# Patient Record
Sex: Male | Born: 1960 | Race: White | Hispanic: No | Smoking: Never smoker
Health system: Southern US, Community
[De-identification: ages and names within clinical notes are randomized; demographics above are authoritative.]

## PROBLEM LIST (undated history)

## (undated) DIAGNOSIS — Z87820 Personal history of traumatic brain injury: Secondary | ICD-10-CM

## (undated) DIAGNOSIS — I669 Occlusion and stenosis of unspecified cerebral artery: Secondary | ICD-10-CM

## (undated) DIAGNOSIS — B958 Unspecified staphylococcus as the cause of diseases classified elsewhere: Secondary | ICD-10-CM

## (undated) HISTORY — PX: BRAIN SURGERY: SHX531

---

## 2006-08-26 ENCOUNTER — Encounter: Payer: Self-pay | Admitting: Internal Medicine

## 2007-01-26 ENCOUNTER — Ambulatory Visit: Payer: Self-pay | Admitting: Internal Medicine

## 2007-01-26 DIAGNOSIS — R945 Abnormal results of liver function studies: Secondary | ICD-10-CM

## 2007-01-26 DIAGNOSIS — F988 Other specified behavioral and emotional disorders with onset usually occurring in childhood and adolescence: Secondary | ICD-10-CM | POA: Insufficient documentation

## 2007-01-26 DIAGNOSIS — I6200 Nontraumatic subdural hemorrhage, unspecified: Secondary | ICD-10-CM

## 2007-01-26 DIAGNOSIS — E785 Hyperlipidemia, unspecified: Secondary | ICD-10-CM | POA: Insufficient documentation

## 2007-01-26 DIAGNOSIS — R7989 Other specified abnormal findings of blood chemistry: Secondary | ICD-10-CM | POA: Insufficient documentation

## 2007-02-11 ENCOUNTER — Encounter (INDEPENDENT_AMBULATORY_CARE_PROVIDER_SITE_OTHER): Payer: Self-pay | Admitting: *Deleted

## 2007-02-17 ENCOUNTER — Ambulatory Visit: Payer: Self-pay | Admitting: Internal Medicine

## 2007-02-22 ENCOUNTER — Encounter (INDEPENDENT_AMBULATORY_CARE_PROVIDER_SITE_OTHER): Payer: Self-pay | Admitting: *Deleted

## 2007-02-25 ENCOUNTER — Encounter: Payer: Self-pay | Admitting: Internal Medicine

## 2007-09-07 ENCOUNTER — Encounter: Payer: Self-pay | Admitting: Internal Medicine

## 2007-11-17 IMAGING — US US ABDOMEN COMPLETE
1 series · 13 of 25 positions shown · non-contrast
Comparison: none

ACCESSION #:  [id number]

 READING PHYSICIAN:  Becka, Arbiona.
 INDICATIONS FOR PROCEDURE:  Abnormal liver function test.
 PROCEDURE:  Multiplanar abdominal ultrasound imaging was performed in the upright, supine, right and left lateral decubitus positions.
 RESULTS:  Abdominal aorta 33 mm with focal dilatation at the proximal portion.  No aneurysm.  The IVC is patent. 
 The pancreas appears normal throughout the head, body and tail without evidence of ductal dilatation, pancreatic masses, or peripancreatic inflammation.  
 Gallbladder is well distended, normal wall thickness of 2.7 mm, with no pericholecystic fluid or intraluminal echogenic foci to suggest gallstone disease.  
 The common bile duct measures 3.8 mm in maximal diameter without evidence of intraluminal foci. 
 The liver with mild increase in echo density, homogenous pattern, no dilated ducts or focal lesions. 
 Kidneys are normal in appearance.  The right kidney 116 mm, left kidney 136 mm. 
 Spleen is normal in size, measuring 110 mm without parenchymal lesion.

[Series 1: us abdomen complete · 0.40mm/px · 13 of 60 slices shown]
[im 1/60]
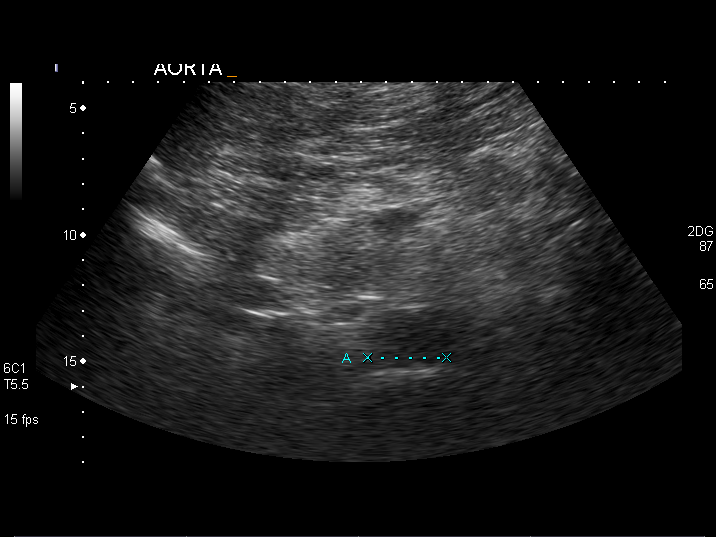
[im 5/60]
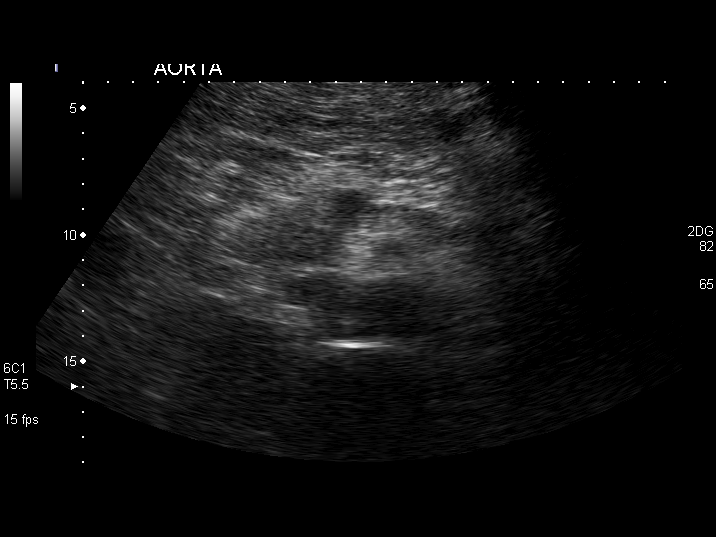
[im 10/60]
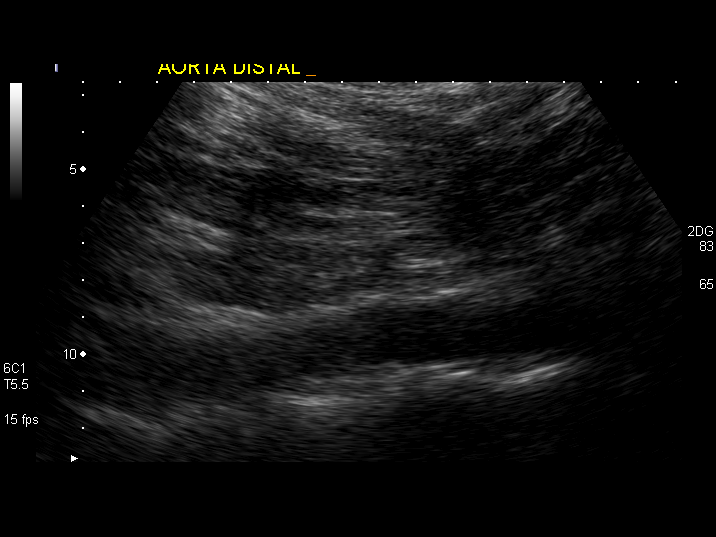
[im 15/60]
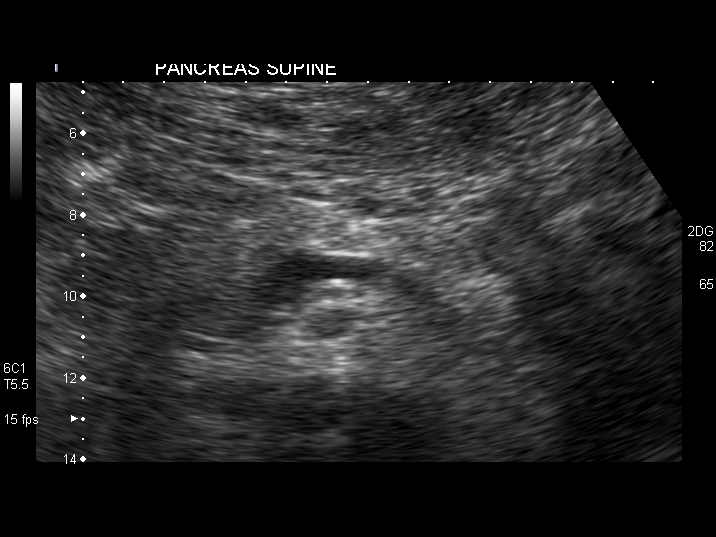
[im 20/60]
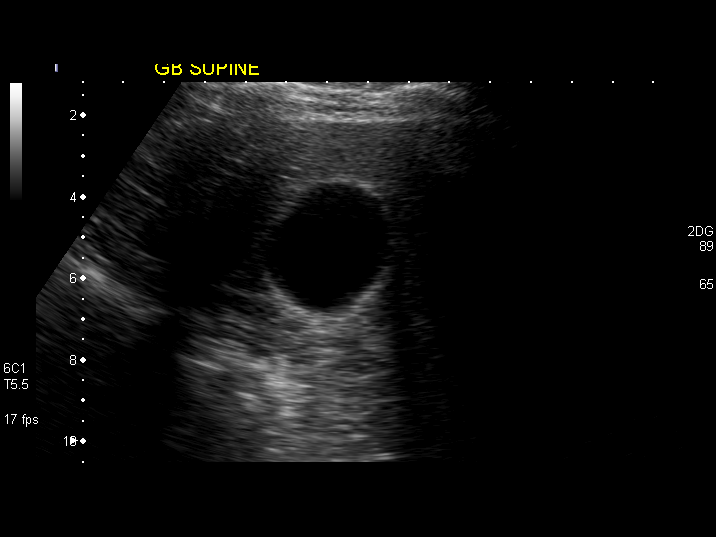
[im 25/60]
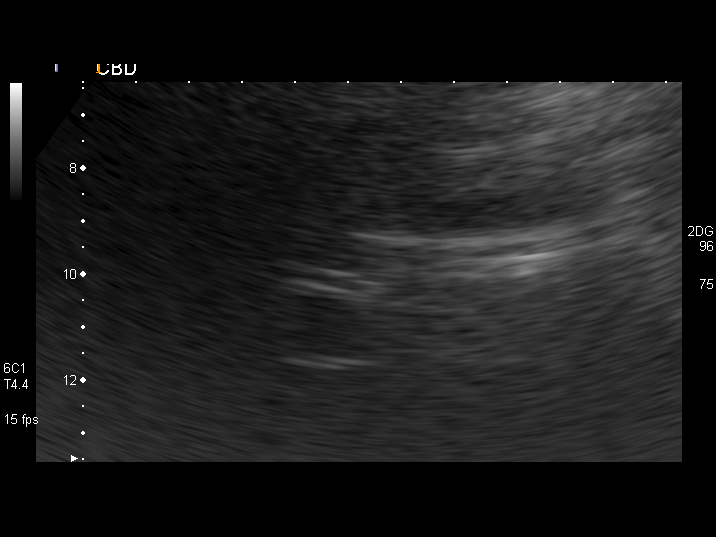
[im 30/60]
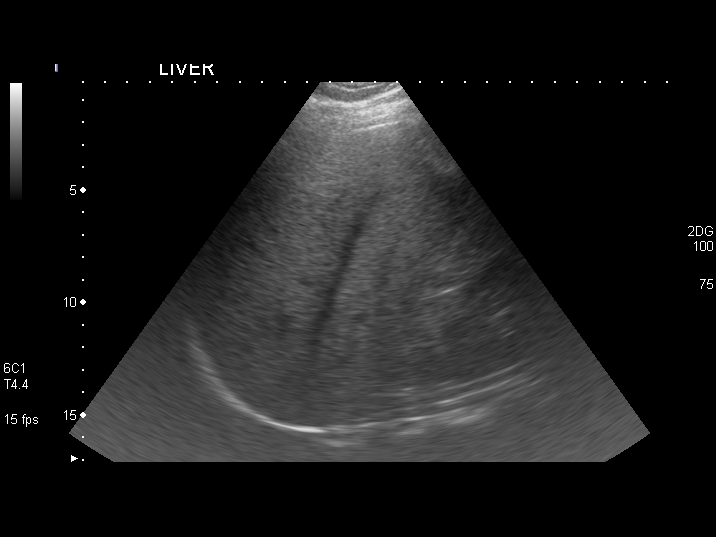
[im 35/60]
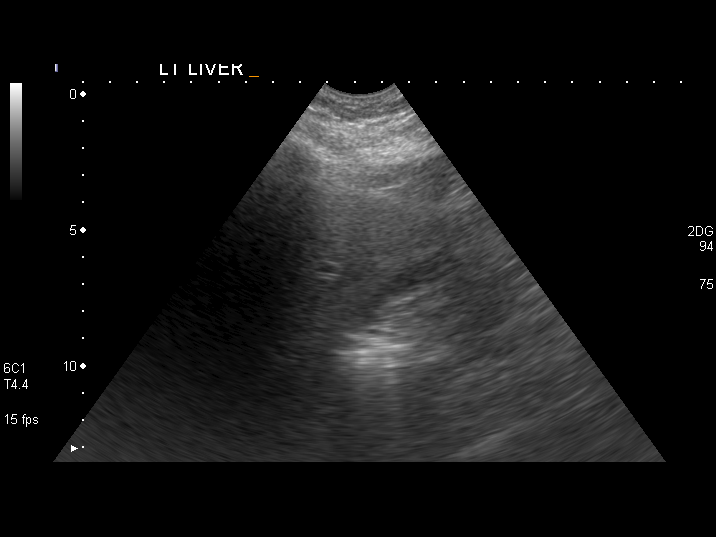
[im 40/60]
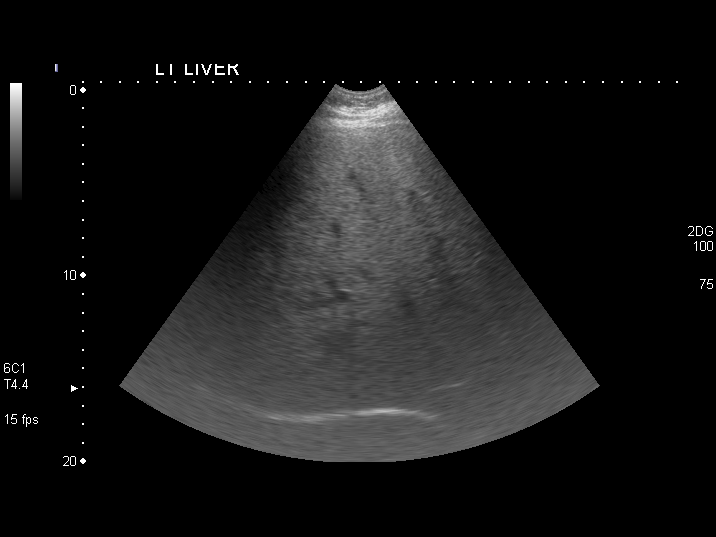
[im 45/60]
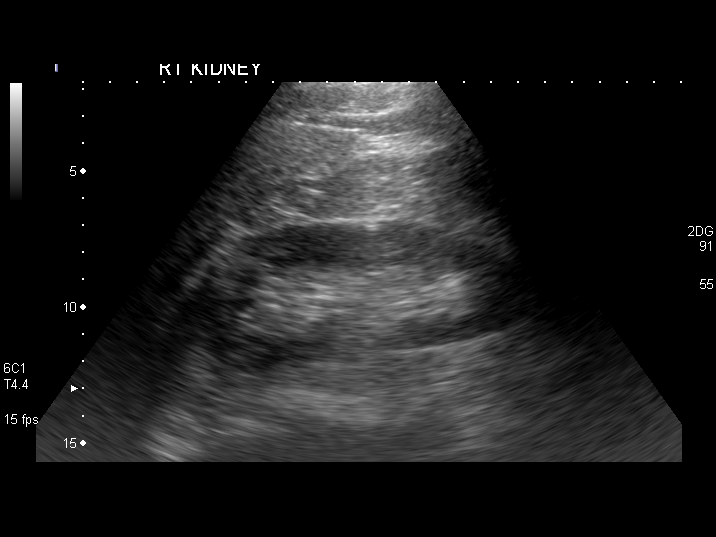
[im 50/60]
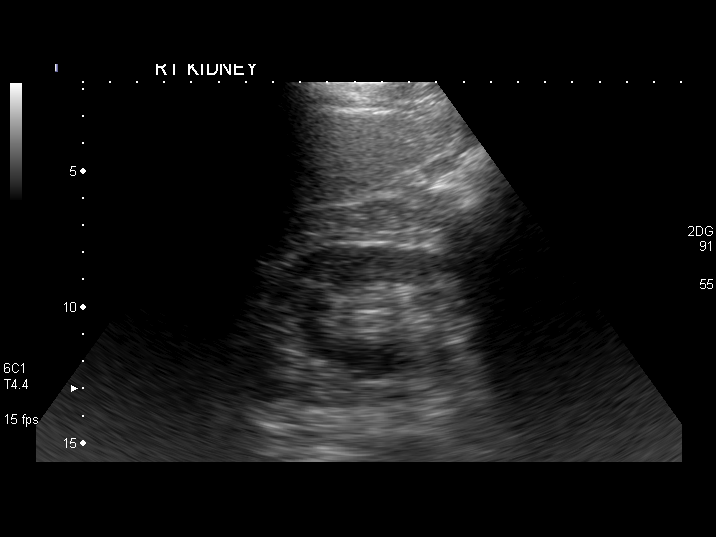
[im 55/60]
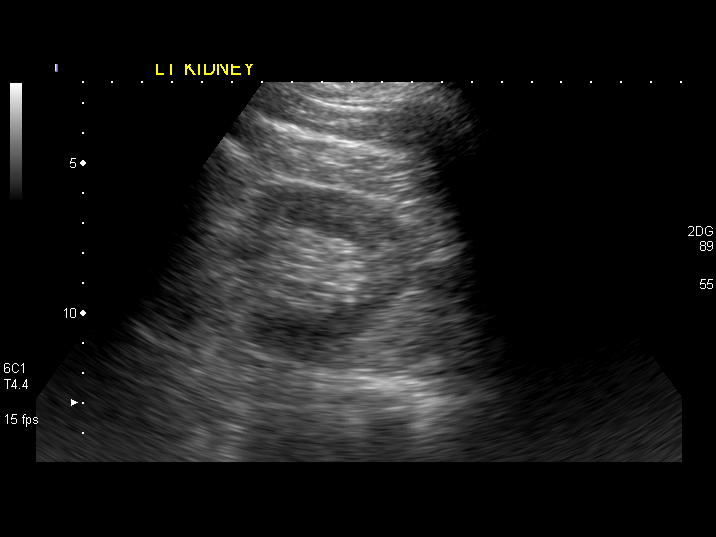
[im 60/60]
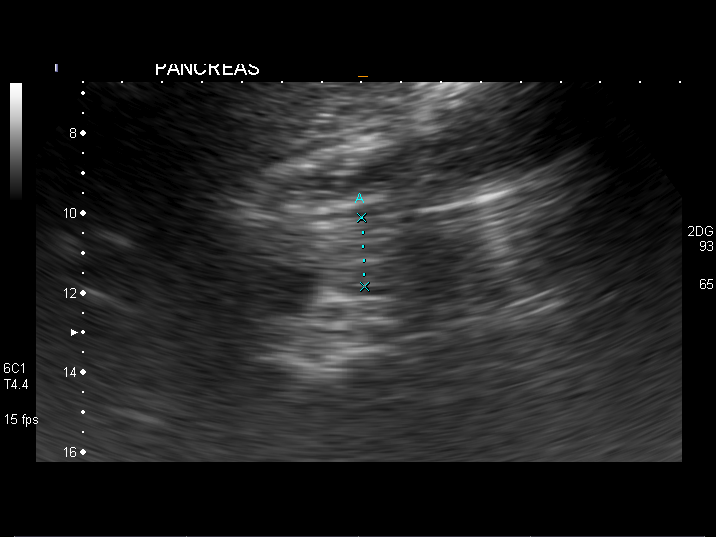

[13 of 25 positions shown; findings below may reference images not displayed]

IMPRESSION: 1.         Mild fatty infiltration of the liver, spleen upper limits of normal.
 2.         Normal gallbladder and pancreas.

## 2010-08-25 ENCOUNTER — Encounter: Payer: Self-pay | Admitting: Sports Medicine

## 2014-04-11 ENCOUNTER — Encounter: Payer: Self-pay | Admitting: Emergency Medicine

## 2014-04-11 ENCOUNTER — Emergency Department
Admission: EM | Admit: 2014-04-11 | Discharge: 2014-04-11 | Disposition: A | Discharge status: Home / Self Care | Payer: BC Managed Care – PPO | Source: Home / Self Care

## 2014-04-11 DIAGNOSIS — M545 Low back pain, unspecified: Secondary | ICD-10-CM

## 2014-04-11 DIAGNOSIS — M62830 Muscle spasm of back: Secondary | ICD-10-CM

## 2014-04-11 HISTORY — DX: Unspecified staphylococcus as the cause of diseases classified elsewhere: B95.8

## 2014-04-11 HISTORY — DX: Occlusion and stenosis of unspecified cerebral artery: I66.9

## 2014-04-11 HISTORY — DX: Personal history of traumatic brain injury: Z87.820

## 2014-04-11 MED ORDER — MELOXICAM 15 MG PO TABS: ORAL_TABLET | ORAL | Status: AC

## 2014-04-11 MED ORDER — PREDNISONE 50 MG PO TABS: 50.0000 mg | ORAL_TABLET | Freq: Every day | ORAL | Status: AC

## 2014-04-11 MED ORDER — CYCLOBENZAPRINE HCL 10 MG PO TABS: ORAL_TABLET | ORAL | Status: AC

## 2014-04-11 MED ORDER — HYDROCODONE-ACETAMINOPHEN 5-325 MG PO TABS: 1.0000 | ORAL_TABLET | Freq: Three times a day (TID) | ORAL | Status: AC | PRN

## 2014-04-11 NOTE — ED Provider Notes (Signed)
CSN: [id number]     Arrival date & time 04/11/14  1357 History   None    Chief Complaint  Patient presents with  . Back Pain   (Consider location/radiation/quality/duration/timing/severity/associated sxs/prior Treatment) HPI Pt is a 53 yo male who presents to the clinic with 3 days of low left sided back pain without radiation. Denies any injury. He has had this happen to him a couple of times and resolved and a few days. He is taking 10-15 ibuprofen daily. He is using heat. Both help a little but pain continues to be 7-8/10. Describes the pain as a dull ache that worsens with straighten out, getting up and moving. better with heat and laying flat. Denies any bowel or bladder dysfunction. No saddle anesthia.  Past Medical History  Diagnosis Date  . Blood clots in brain   . History of concussion   . Staph infection    Past Surgical History  Procedure Laterality Date  . Brain surgery      blood clot 53yo   Family History  Problem Relation Age of Onset  . Heart attack Mother   . Heart disease Father   . Cancer Father     intestinal CA  . Alzheimer's disease Father    History  Substance Use Topics  . Smoking status: Never Smoker   . Smokeless tobacco: Current User  . Alcohol Use: No    Review of Systems  All other systems reviewed and are negative.   Allergies  Review of patient's allergies indicates no known allergies.  Home Medications   Prior to Admission medications   Medication Sig Start Date End Date Taking? Authorizing Provider  amphetamine-dextroamphetamine (ADDERALL) 20 MG tablet Take 20 mg by mouth 2 (two) times daily.   Yes Historical Provider, MD  buPROPion (WELLBUTRIN XL) 150 MG 24 hr tablet Take 150 mg by mouth daily.   Yes Historical Provider, MD  tadalafil (CIALIS) 5 MG tablet Take 5 mg by mouth daily as needed for erectile dysfunction.   Yes Historical Provider, MD  cyclobenzaprine (FLEXERIL) 10 MG tablet One half tab PO qHS, then increase gradually to  one tab TID. 04/11/14   Jomarie Longs, PA-C  HYDROcodone-acetaminophen (NORCO/VICODIN) 5-325 MG per tablet Take 1 tablet by mouth every 8 (eight) hours as needed for moderate pain. 04/11/14   Boruch Manuele L Achilles Neville, PA-C  meloxicam (MOBIC) 15 MG tablet One tab PO qAM with breakfast for 2 weeks, then daily prn pain. 04/11/14   Adalay Azucena L Everitt Wenner, PA-C  predniSONE (DELTASONE) 50 MG tablet Take 1 tablet (50 mg total) by mouth daily. 04/11/14   Tasia Liz L Yarden Manuelito, PA-C   BP 104/68  Pulse 76  Temp(Src) 98.8 F (37.1 C) (Oral)  Resp 16  Ht  (1.778 m)  Wt 201 lb (91.173 kg)  BMI 28.84 kg/m2  SpO2 99% Physical Exam  Constitutional: He is oriented to person, place, and time. He appears well-developed and well-nourished.  HENT:  Head: Normocephalic and atraumatic.  Cardiovascular: Normal rate, regular rhythm and normal heart sounds.   Pulmonary/Chest: Effort normal and breath sounds normal. He has no wheezes.  Musculoskeletal:  Little no none ROM at waist due to pain.  No pain to palpation over lumbar spine.  Left sided paraspinous tightness.  Negative straight leg test.  Patellar reflexes 2+ and symmetric.  Strength of lower extremities 5/5 bilateral.   Neurological: He is alert and oriented to person, place, and time.  Psychiatric: He has a normal mood and affect.  His behavior is normal.    ED Course  Procedures (including critical care time) Labs Review Labs Reviewed - No data to display  Imaging Review No results found.   MDM   1. Left-sided low back pain without sciatica   2. Muscle spasm of back    Pt declined shot of toradol due to fear of needles.  Pt given prednisone burst for 5 days.  mobic daily and stop ibuprofen.  Flexeril as needed up to three times a day. Sedation warning given.  norco for acute pain.  Low back strectches given to do twice daily.  Follow up with any red flag symptoms or worsening pain.     Jomarie Longs, PA-C 04/11/14 1641

## 2014-04-11 NOTE — ED Notes (Signed)
Pt c/o LT sided low back pain x 3 days. Denies injury. He has taken IBF and applied heat with no relief.

## 2014-04-11 NOTE — Discharge Instructions (Signed)
Low Back Sprain with Rehab  A sprain is an injury in which a ligament is torn. The ligaments of the lower back are vulnerable to sprains. However, they are strong and require great force to be injured. These ligaments are important for stabilizing the spinal column. Sprains are classified into three categories. Grade 1 sprains cause pain, but the tendon is not lengthened. Grade 2 sprains include a lengthened ligament, due to the ligament being stretched or partially ruptured. With grade 2 sprains there is still function, although the function may be decreased. Grade 3 sprains involve a complete tear of the tendon or muscle, and function is usually impaired. SYMPTOMS   Severe pain in the lower back.  Sometimes, a feeling of a "pop," "snap," or tear, at the time of injury.  Tenderness and sometimes swelling at the injury site.  Uncommonly, bruising (contusion) within 48 hours of injury.  Muscle spasms in the back. CAUSES  Low back sprains occur when a force is placed on the ligaments that is greater than they can handle. Common causes of injury include:  Performing a stressful act while off-balance.  Repetitive stressful activities that involve movement of the lower back.  Direct hit (trauma) to the lower back. RISK INCREASES WITH:  Contact sports (football, wrestling).  Collisions (major skiing accidents).  Sports that require throwing or lifting (baseball, weightlifting).  Sports involving twisting of the spine (gymnastics, diving, tennis, golf).  Poor strength and flexibility.  Inadequate protection.  Previous back injury or surgery (especially fusion). PREVENTION  Wear properly fitted and padded protective equipment.  Warm up and stretch properly before activity.  Allow for adequate recovery between workouts.  Maintain physical fitness:  Strength, flexibility, and endurance.  Cardiovascular fitness.  Maintain a healthy body weight. PROGNOSIS  If treated  properly, low back sprains usually heal with non-surgical treatment. The length of time for healing depends on the severity of the injury.  RELATED COMPLICATIONS   Recurring symptoms, resulting in a chronic problem.  Chronic inflammation and pain in the low back.  Delayed healing or resolution of symptoms, especially if activity is resumed too soon.  Prolonged impairment.  Unstable or arthritic joints of the low back. TREATMENT  Treatment first involves the use of ice and medicine, to reduce pain and inflammation. The use of strengthening and stretching exercises may help reduce pain with activity. These exercises may be performed at home or with a therapist. Severe injuries may require referral to a therapist for further evaluation and treatment, such as ultrasound. Your caregiver may advise that you wear a back brace or corset, to help reduce pain and discomfort. Often, prolonged bed rest results in greater harm then benefit. Corticosteroid injections may be recommended. However, these should be reserved for the most serious cases. It is important to avoid using your back when lifting objects. At night, sleep on your back on a firm mattress, with a pillow placed under your knees. If non-surgical treatment is unsuccessful, surgery may be needed.  MEDICATION   If pain medicine is needed, nonsteroidal anti-inflammatory medicines (aspirin and ibuprofen), or other minor pain relievers (acetaminophen), are often advised.  Do not take pain medicine for 7 days before surgery.  Prescription pain relievers may be given, if your caregiver thinks they are needed. Use only as directed and only as much as you need.  Ointments applied to the skin may be helpful.  Corticosteroid injections may be given by your caregiver. These injections should be reserved for the most serious cases,   because they may only be given a certain number of times. HEAT AND COLD  Cold treatment (icing) should be applied for 10  to 15 minutes every 2 to 3 hours for inflammation and pain, and immediately after activity that aggravates your symptoms. Use ice packs or an ice massage.  Heat treatment may be used before performing stretching and strengthening activities prescribed by your caregiver, physical therapist, or athletic trainer. Use a heat pack or a warm water soak. SEEK MEDICAL CARE IF:   Symptoms get worse or do not improve in 2 to 4 weeks, despite treatment.  You develop numbness or weakness in either leg.  You lose bowel or bladder function.  Any of the following occur after surgery: fever, increased pain, swelling, redness, drainage of fluids, or bleeding in the affected area.  New, unexplained symptoms develop. (Drugs used in treatment may produce side effects.) EXERCISES  RANGE OF MOTION (ROM) AND STRETCHING EXERCISES - Low Back Sprain Most people with lower back pain will find that their symptoms get worse with excessive bending forward (flexion) or arching at the lower back (extension). The exercises that will help resolve your symptoms will focus on the opposite motion.  Your physician, physical therapist or athletic trainer will help you determine which exercises will be most helpful to resolve your lower back pain. Do not complete any exercises without first consulting with your caregiver. Discontinue any exercises which make your symptoms worse, until you speak to your caregiver. If you have pain, numbness or tingling which travels down into your buttocks, leg or foot, the goal of the therapy is for these symptoms to move closer to your back and eventually resolve. Sometimes, these leg symptoms will get better, but your lower back pain may worsen. This is often an indication of progress in your rehabilitation. Be very alert to any changes in your symptoms and the activities in which you participated in the 24 hours prior to the change. Sharing this information with your caregiver will allow him or her to  most efficiently treat your condition. These exercises may help you when beginning to rehabilitate your injury. Your symptoms may resolve with or without further involvement from your physician, physical therapist or athletic trainer. While completing these exercises, remember:   Restoring tissue flexibility helps normal motion to return to the joints. This allows healthier, less painful movement and activity.  An effective stretch should be held for at least 30 seconds.  A stretch should never be painful. You should only feel a gentle lengthening or release in the stretched tissue. FLEXION RANGE OF MOTION AND STRETCHING EXERCISES: STRETCH - Flexion, Single Knee to Chest   Lie on a firm bed or floor with both legs extended in front of you.  Keeping one leg in contact with the floor, bring your opposite knee to your chest. Hold your leg in place by either grabbing behind your thigh or at your knee.  Pull until you feel a gentle stretch in your low back. Hold __________ seconds.  Slowly release your grasp and repeat the exercise with the opposite side. Repeat __________ times. Complete this exercise __________ times per day.  STRETCH - Flexion, Double Knee to Chest  Lie on a firm bed or floor with both legs extended in front of you.  Keeping one leg in contact with the floor, bring your opposite knee to your chest.  Tense your stomach muscles to support your back and then lift your other knee to your chest. Hold your legs   in place by either grabbing behind your thighs or at your knees.  Pull both knees toward your chest until you feel a gentle stretch in your low back. Hold __________ seconds.  Tense your stomach muscles and slowly return one leg at a time to the floor. Repeat __________ times. Complete this exercise __________ times per day.  STRETCH - Low Trunk Rotation  Lie on a firm bed or floor. Keeping your legs in front of you, bend your knees so they are both pointed toward the  ceiling and your feet are flat on the floor.  Extend your arms out to the side. This will stabilize your upper body by keeping your shoulders in contact with the floor.  Gently and slowly drop both knees together to one side until you feel a gentle stretch in your low back. Hold for __________ seconds.  Tense your stomach muscles to support your lower back as you bring your knees back to the starting position. Repeat the exercise to the other side. Repeat __________ times. Complete this exercise __________ times per day  EXTENSION RANGE OF MOTION AND FLEXIBILITY EXERCISES: STRETCH - Extension, Prone on Elbows   Lie on your stomach on the floor, a bed will be too soft. Place your palms about shoulder width apart and at the height of your head.  Place your elbows under your shoulders. If this is too painful, stack pillows under your chest.  Allow your body to relax so that your hips drop lower and make contact more completely with the floor.  Hold this position for __________ seconds.  Slowly return to lying flat on the floor. Repeat __________ times. Complete this exercise __________ times per day.  RANGE OF MOTION - Extension, Prone Press Ups  Lie on your stomach on the floor, a bed will be too soft. Place your palms about shoulder width apart and at the height of your head.  Keeping your back as relaxed as possible, slowly straighten your elbows while keeping your hips on the floor. You may adjust the placement of your hands to maximize your comfort. As you gain motion, your hands will come more underneath your shoulders.  Hold this position __________ seconds.  Slowly return to lying flat on the floor. Repeat __________ times. Complete this exercise __________ times per day.  RANGE OF MOTION- Quadruped, Neutral Spine   Assume a hands and knees position on a firm surface. Keep your hands under your shoulders and your knees under your hips. You may place padding under your knees for  comfort.  Drop your head and point your tailbone toward the ground below you. This will round out your lower back like an angry cat. Hold this position for __________ seconds.  Slowly lift your head and release your tail bone so that your back sags into a large arch, like an old horse.  Hold this position for __________ seconds.  Repeat this until you feel limber in your low back.  Now, find your "sweet spot." This will be the most comfortable position somewhere between the two previous positions. This is your neutral spine. Once you have found this position, tense your stomach muscles to support your low back.  Hold this position for __________ seconds. Repeat __________ times. Complete this exercise __________ times per day.  STRENGTHENING EXERCISES - Low Back Sprain These exercises may help you when beginning to rehabilitate your injury. These exercises should be done near your "sweet spot." This is the neutral, low-back arch, somewhere between fully rounded   and fully arched, that is your least painful position. When performed in this safe range of motion, these exercises can be used for people who have either a flexion or extension based injury. These exercises may resolve your symptoms with or without further involvement from your physician, physical therapist or athletic trainer. While completing these exercises, remember:   Muscles can gain both the endurance and the strength needed for everyday activities through controlled exercises.  Complete these exercises as instructed by your physician, physical therapist or athletic trainer. Increase the resistance and repetitions only as guided.  You may experience muscle soreness or fatigue, but the pain or discomfort you are trying to eliminate should never worsen during these exercises. If this pain does worsen, stop and make certain you are following the directions exactly. If the pain is still present after adjustments, discontinue the  exercise until you can discuss the trouble with your caregiver. STRENGTHENING - Deep Abdominals, Pelvic Tilt   Lie on a firm bed or floor. Keeping your legs in front of you, bend your knees so they are both pointed toward the ceiling and your feet are flat on the floor.  Tense your lower abdominal muscles to press your low back into the floor. This motion will rotate your pelvis so that your tail bone is scooping upwards rather than pointing at your feet or into the floor. With a gentle tension and even breathing, hold this position for __________ seconds. Repeat __________ times. Complete this exercise __________ times per day.  STRENGTHENING - Abdominals, Crunches   Lie on a firm bed or floor. Keeping your legs in front of you, bend your knees so they are both pointed toward the ceiling and your feet are flat on the floor. Cross your arms over your chest.  Slightly tip your chin down without bending your neck.  Tense your abdominals and slowly lift your trunk high enough to just clear your shoulder blades. Lifting higher can put excessive stress on the lower back and does not further strengthen your abdominal muscles.  Control your return to the starting position. Repeat __________ times. Complete this exercise __________ times per day.  STRENGTHENING - Quadruped, Opposite UE/LE Lift   Assume a hands and knees position on a firm surface. Keep your hands under your shoulders and your knees under your hips. You may place padding under your knees for comfort.  Find your neutral spine and gently tense your abdominal muscles so that you can maintain this position. Your shoulders and hips should form a rectangle that is parallel with the floor and is not twisted.  Keeping your trunk steady, lift your right hand no higher than your shoulder and then your left leg no higher than your hip. Make sure you are not holding your breath. Hold this position for __________ seconds.  Continuing to keep  your abdominal muscles tense and your back steady, slowly return to your starting position. Repeat with the opposite arm and leg. Repeat __________ times. Complete this exercise __________ times per day.  STRENGTHENING - Abdominals and Quadriceps, Straight Leg Raise   Lie on a firm bed or floor with both legs extended in front of you.  Keeping one leg in contact with the floor, bend the other knee so that your foot can rest flat on the floor.  Find your neutral spine, and tense your abdominal muscles to maintain your spinal position throughout the exercise.  Slowly lift your straight leg off the floor about 6 inches for a count   of 15, making sure to not hold your breath.  Still keeping your neutral spine, slowly lower your leg all the way to the floor. Repeat this exercise with each leg __________ times. Complete this exercise __________ times per day. POSTURE AND BODY MECHANICS CONSIDERATIONS - Low Back Sprain Keeping correct posture when sitting, standing or completing your activities will reduce the stress put on different body tissues, allowing injured tissues a chance to heal and limiting painful experiences. The following are general guidelines for improved posture. Your physician or physical therapist will provide you with any instructions specific to your needs. While reading these guidelines, remember:  The exercises prescribed by your provider will help you have the flexibility and strength to maintain correct postures.  The correct posture provides the best environment for your joints to work. All of your joints have less wear and tear when properly supported by a spine with good posture. This means you will experience a healthier, less painful body.  Correct posture must be practiced with all of your activities, especially prolonged sitting and standing. Correct posture is as important when doing repetitive low-stress activities (typing) as it is when doing a single heavy-load  activity (lifting). RESTING POSITIONS Consider which positions are most painful for you when choosing a resting position. If you have pain with flexion-based activities (sitting, bending, stooping, squatting), choose a position that allows you to rest in a less flexed posture. You would want to avoid curling into a fetal position on your side. If your pain worsens with extension-based activities (prolonged standing, working overhead), avoid resting in an extended position such as sleeping on your stomach. Most people will find more comfort when they rest with their spine in a more neutral position, neither too rounded nor too arched. Lying on a non-sagging bed on your side with a pillow between your knees, or on your back with a pillow under your knees will often provide some relief. Keep in mind, being in any one position for a prolonged period of time, no matter how correct your posture, can still lead to stiffness. PROPER SITTING POSTURE In order to minimize stress and discomfort on your spine, you must sit with correct posture. Sitting with good posture should be effortless for a healthy body. Returning to good posture is a gradual process. Many people can work toward this most comfortably by using various supports until they have the flexibility and strength to maintain this posture on their own. When sitting with proper posture, your ears will fall over your shoulders and your shoulders will fall over your hips. You should use the back of the chair to support your upper back. Your lower back will be in a neutral position, just slightly arched. You may place a small pillow or folded towel at the base of your lower back for  support.  When working at a desk, create an environment that supports good, upright posture. Without extra support, muscles tire, which leads to excessive strain on joints and other tissues. Keep these recommendations in mind: CHAIR:  A chair should be able to slide under your desk  when your back makes contact with the back of the chair. This allows you to work closely.  The chair's height should allow your eyes to be level with the upper part of your monitor and your hands to be slightly lower than your elbows. BODY POSITION  Your feet should make contact with the floor. If this is not possible, use a foot rest.  Keep your   ears over your shoulders. This will reduce stress on your neck and low back. INCORRECT SITTING POSTURES  If you are feeling tired and unable to assume a healthy sitting posture, do not slouch or slump. This puts excessive strain on your back tissues, causing more damage and pain. Healthier options include:  Using more support, like a lumbar pillow.  Switching tasks to something that requires you to be upright or walking.  Talking a brief walk.  Lying down to rest in a neutral-spine position. PROLONGED STANDING WHILE SLIGHTLY LEANING FORWARD  When completing a task that requires you to lean forward while standing in one place for a long time, place either foot up on a stationary 2-4 inch high object to help maintain the best posture. When both feet are on the ground, the lower back tends to lose its slight inward curve. If this curve flattens (or becomes too large), then the back and your other joints will experience too much stress, tire more quickly, and can cause pain. CORRECT STANDING POSTURES Proper standing posture should be assumed with all daily activities, even if they only take a few moments, like when brushing your teeth. As in sitting, your ears should fall over your shoulders and your shoulders should fall over your hips. You should keep a slight tension in your abdominal muscles to brace your spine. Your tailbone should point down to the ground, not behind your body, resulting in an over-extended swayback posture.  INCORRECT STANDING POSTURES  Common incorrect standing postures include a forward head, locked knees and/or an excessive  swayback. WALKING Walk with an upright posture. Your ears, shoulders and hips should all line-up. PROLONGED ACTIVITY IN A FLEXED POSITION When completing a task that requires you to bend forward at your waist or lean over a low surface, try to find a way to stabilize 3 out of 4 of your limbs. You can place a hand or elbow on your thigh or rest a knee on the surface you are reaching across. This will provide you more stability, so that your muscles do not tire as quickly. By keeping your knees relaxed, or slightly bent, you will also reduce stress across your lower back. CORRECT LIFTING TECHNIQUES DO :  Assume a wide stance. This will provide you more stability and the opportunity to get as close as possible to the object which you are lifting.  Tense your abdominals to brace your spine. Bend at the knees and hips. Keeping your back locked in a neutral-spine position, lift using your leg muscles. Lift with your legs, keeping your back straight.  Test the weight of unknown objects before attempting to lift them.  Try to keep your elbows locked down at your sides in order get the best strength from your shoulders when carrying an object.  Always ask for help when lifting heavy or awkward objects. INCORRECT LIFTING TECHNIQUES DO NOT:   Lock your knees when lifting, even if it is a small object.  Bend and twist. Pivot at your feet or move your feet when needing to change directions.  Assume that you can safely pick up even a paperclip without proper posture. Document Released: 07/21/2005 Document Revised: 10/13/2011 Document Reviewed: 11/02/2008 [employer] Patient Information 2015 [employer]. This information is not intended to replace advice given to you by your health care provider. Make sure you discuss any questions you have with your health care provider.  

## 2014-04-12 NOTE — ED Provider Notes (Signed)
Agree with exam, assessment, and plan.   Lattie Haw, MD 04/12/14 [phone]
